# Patient Record
Sex: Male | Born: 1975 | Race: Black or African American | Hispanic: No | Marital: Single | Smoking: Current some day smoker
Health system: Southern US, Community
[De-identification: ages and names within clinical notes are randomized; demographics above are authoritative.]

---

## 2007-01-23 ENCOUNTER — Inpatient Hospital Stay (HOSPITAL_COMMUNITY): Admission: EM | Admit: 2007-01-23 | Discharge: 2007-02-03 | Payer: Self-pay | Admitting: Emergency Medicine

## 2007-02-13 ENCOUNTER — Encounter: Admission: RE | Admit: 2007-02-13 | Discharge: 2007-02-13 | Payer: Self-pay | Admitting: General Surgery

## 2011-01-07 ENCOUNTER — Emergency Department (HOSPITAL_COMMUNITY)
Admission: EM | Admit: 2011-01-07 | Discharge: 2011-01-07 | Disposition: A | Discharge status: Home / Self Care | Payer: No Typology Code available for payment source | Attending: Emergency Medicine | Admitting: Emergency Medicine

## 2011-01-07 ENCOUNTER — Emergency Department (HOSPITAL_COMMUNITY): Payer: No Typology Code available for payment source

## 2011-01-07 DIAGNOSIS — M542 Cervicalgia: Secondary | ICD-10-CM | POA: Insufficient documentation

## 2011-01-07 DIAGNOSIS — E669 Obesity, unspecified: Secondary | ICD-10-CM | POA: Insufficient documentation

## 2011-01-29 NOTE — H&P (Signed)
Jordan, Jordan            ACCOUNT NO.:  [id number]   MEDICAL RECORD NO.:  [id number]          PATIENT TYPE:  EMS   LOCATION:  MAJO                         FACILITY:  [facility]   PHYSICIAN:  Hettie Holstein, D.O.    DATE OF BIRTH:  [birth year]   DATE OF ADMISSION:  01/23/2007  DATE OF DISCHARGE:                              HISTORY & PHYSICAL   PRIMARY CARE PHYSICIAN:  Unassigned.   CHIEF COMPLAINT:  Abdominal pain.   HISTORY OF PRESENT ILLNESS:  Jordan Jordan is a very pleasant and  normally healthy 35 year old African American male, who has had no  previous hospitalizations or medical problems or known GI or bowel  irregularities in the past, who gradually developed diffuse abdominal  pain about 2 days ago.  He stated he tolerated this, continued to work,  then he stated that his abdominal pain became excruciating.  He was able  to experience some relief with inducing emesis.  He describes some  watery stools.  No blood or coffee grounds noted, no melena.  He has not  eaten for the past couple of days due to the discomfort.  He has only  had fluids and otherwise had some fevers and chills the night prior to  presentation.  He presented to the emergency department via private  vehicle and was evaluated in the emergency room, discovered to have an  elevated white count of 25,600 and underwent CT scanning of his abdomen.  Unfortunately, these findings were not conclusive.  It did reveal some  pericecal inflammation with a small amount of free air around the cecum  but there was no abscesses.  His appendix appeared normal.  He had some  fatty infiltration of the wall of his distal small bowel and throughout  the colon.  There was some suggestion of a possibility for an  inflammatory bowel disease, perhaps ulcerative colitis.   I did contact general surgery, Dr. Ezzard Standing, to review these images as  well to assure that we are not missing a surgical abdomen, though his  appendix does  appear normal.  I am electing to consult with general  surgery to review these images.   PAST MEDICAL HISTORY:  Of this is unremarkable.  No prior surgeries.   MEDICATIONS AT HOME:  He takes no medications.   ALLERGIES:  No known drug allergies.   FAMILY HISTORY:  His mother is 44, alive and well.  Father is 74, alive  and well.  No known health illnesses.  No cancer in the family, no  irritable bowel disease in the family.   REVIEW OF SYSTEMS:  He had been in his usual state of health.  His is  losing some weight.  He states that this is somewhat intentional as he  has cut meat out of his diet since October and November.  His appetite  has been stable up until recently.  He denies any blood in his stools,  any bloody emesis.  He otherwise denies any chest pain, shortness of  breath, swelling of his extremities.   SOCIAL HISTORY:  He works in a Art therapist.  He lives by  himself.  He does have a girlfriend.  Smokes occasional marijuana and  only occasional alcohol.  Otherwise no illicit drug use with the  exception of marijuana.   PHYSICAL EXAMINATION:  VITAL SIGNS:  Temperature 99.1, blood pressure  124/83, heart rate 114, respirations 22, O2 saturation 100% on room air.  GENERAL:  Jordan Jordan is nontoxic in appearance, alert and oriented,  quite pleasant.  HEENT: Head normocephalic, atraumatic.  Extraocular muscles are intact.  NECK:  Supple and nontender.  No palpable thyromegaly or mass.  CARDIOVASCULAR:  Normal S1, S2.  LUNGS:  Clear bilaterally with normal effort.  No dullness to  percussion.  ABDOMEN:  Soft.  There is no rebound or guarding or rigidity.  He does  have some tenderness to palpation of the right upper and lower regions  of his abdomen.  There is no costovertebral angle tenderness.  His bowel  sounds are normoactive.  RECTAL:  Rectal exam performed revealed no stool in the rectal vault.  Effluent was negative Hemoccult.  EXTREMITIES:  Lower  extremities reveal no edema, no cyanosis or  clubbing.  The peripheral pulses are symmetrical and palpable  bilaterally.   LABORATORY DATA:  WBC 25,600, hemoglobin 14, platelet count 301, MCV of  86.  Sodium was 137, potassium 3.8, BUN 5, creatinine 1.0, glucose 115.  AST/ALT 13/11.  Lipase is 20.   CT scan:  As noted above, there was some pericecal inflammation with  small amounts of free air, not certain.  I am awaiting further  interpretation per Dr. Ezzard Standing.   ASSESSMENT:  1. Abdominal pain with abnormal CT scan of the abdomen.  2. Leukocytosis.  3. Inflammatory finding on CT.  Perhaps ulcerative colitis, the      clinical picture is not completely consistent.  We will pursue      stool cultures and studies, and we will check an HIV screen.  4. Obesity.   PLAN:  We will consult general surgery for further review of these  images and initiate empiric antibiotics, obtain stool cultures, obtain  HIV screen, administer IV fluids, and pain control.      Hettie Holstein, D.O.  Electronically Signed     ESS/MEDQ  D:  01/23/2007  T:  01/24/2007  Job:  [id number]

## 2011-01-29 NOTE — Consult Note (Signed)
Jordan Jordan, Jordan Jordan            ACCOUNT NO.:  [id number]   MEDICAL RECORD NO.:  [id number]          PATIENT TYPE:  INP   LOCATION:  5025                         FACILITY:  [facility]   PHYSICIAN:  Sandria Bales. Ezzard Standing, M.D.  DATE OF BIRTH:  [birth year]   DATE OF CONSULTATION:  01/23/2007  DATE OF DISCHARGE:                                 CONSULTATION   STAT CONSULTATION   HISTORY OF ILLNESS:  Jordan Jordan is a 35 year old black male, who was  in otherwise good health until this past Wednesday, the 7th of May,  2008, when he developed lower abdominal pain across sort of his lower  abdomen.  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  Thursday,  he was feeling worse.  The pain was localizing more to the right lower  quadrant.  He tried a laxative without any benefit.  He induced vomiting  without any benefit.  On Friday, the 9th of May, he went to [facility]  on [address], and they referred him to the [facility] Emergency  Room.   He denies any history of peptic ulcer disease, liver disease, pancreatic  disease, or colon disease.  He has had no prior abdominal surgery.  He  has no history of Crohn's disease or ulcerative colitis.  He has had no  prior colonic exam.  He has normally regular bowel movements with  occasional constipation.  He has noticed no blood in his stool and no  change in his bowel habits.   He underwent a CT scan, which shows inflammation on the medial aspect of  his cecum/right colon with some dots of free air.  His appendix can be  traced down and it appears to be entirely normal.  I reviewed his CT  scan with Jordan Jordan.   PAST MEDICAL HISTORY:  He has no allergies.   He is on no medications.   REVIEW OF SYSTEMS:  NEUROLOGIC:  No history of seizure or loss of  consciousness.  PULMONARY:  Does not smoke cigarettes, no history of pneumonia or  tuberculosis.  CARDIAC:  He has no history of heart disease, chest pain, or  hypertension.  GASTROINTESTINAL:  See History of Present Illness.  UROLOGIC:  No history of kidney stones or kidney infections.   He is single, his girlfriend is in the room, her name is Jordan Jordan, and  his mother is in the room with him.   PHYSICAL EXAMINATION:  VITAL SIGNS:  His temperature is 98.8, his blood  pressure 126/73, pulse of 112, respirations 20.  He says his weight is  about 260 pounds.  GENERAL:  He is a well-nourished, large of not obese, black male.  HEENT:  Unremarkable.  He is bearded.  NECK:  Supple, I feel no masses or thyromegaly.  LUNGS:  Clear to auscultation with symmetric breath sounds.  HEART:  Regular rate and rhythm, I hear no murmur or rub.  ABDOMEN:  He actually has some bowel sounds present, maybe slightly  decreased.  He is tender in his right lateral and right lower quadrant  of his abdomen.  He has no palpable mass, but he is obese enough that it  would be hard to tell if there is any mass.  I feel no hernia.  Dr.  Angelena Sole has already done a rectal exam, and he had no stool in his rectal  vault.  EXTREMITIES:  He has good strength in all four extremities.  NEUROLOGIC:  Grossly intact.   LABORATORY DATA:  Sodium of 137, potassium 3.8, chloride of 101, CO2 of  28, glucose of 115, creatinine of 1.0.  His white blood count is 25,600,  his hemoglobin 14, his hematocrit 41, platelet count 301,000.  His  urinalysis was unremarkable.   IMPRESSION:  Cecal inflammation/perforation:  Whether this is cecal  diverticulitis versus typhlitis versus inflammatory bowel disease versus  some unknown diagnosis is unclear.  It appears to be highly unlikely  this is due to appendicitis.   I agree with the present treatment plan, which will be keeping him NPO,  place him on IV antibiotics, place on IV fluids, and repeat the labs  over the next couple of days and see how he progresses.  I discussed  with him about the possibility of needing a laparotomy if he does not  improve and the  possibility of bowel resection, even ostomy.  I think he  understands both the reasons for his hospitalization and the potential  complications.      Sandria Bales. Ezzard Standing, M.D.  Electronically Signed     DHN/MEDQ  D:  01/23/2007  T:  01/23/2007  Job:  [id number]   cc:   Jordan Jordan, D.O.

## 2011-02-01 NOTE — Discharge Summary (Signed)
Jordan Jordan, Jordan Jordan            ACCOUNT NO.:  [id number]   MEDICAL RECORD NO.:  [id number]          PATIENT TYPE:  INP   LOCATION:  5025                         FACILITY:  [facility]   PHYSICIAN:  Ollen Gross. Vernell Morgans, M.D. DATE OF BIRTH:  July 16, 1976   DATE OF ADMISSION:  01/23/2007  DATE OF DISCHARGE:  02/03/2007                               DISCHARGE SUMMARY   CHIEF COMPLAINT AND REASON FOR ADMISSION:  Jordan Jordan is an otherwise  healthy young man patient who began developing diffuse abdominal pain 2  days prior to admission.  Pain abruptly became more excruciating.  He  stated he felt somewhat better if he caused himself to vomit.  He had  been having watery stools without any obvious blood and melena.  Because  he was having fever and chills and was unable to eat and having  significant pain, he presented to the ER, where he was found to have a  white count of 25,600.  A CT scan was done that revealed pericecal  inflammation with a small amount of free air around the cecum without  evidence of abscess.  Appendix appeared normal; because of this, the  patient was admitted by the medicine team, but surgical consult was  obtained with Dr. Ezzard Standing.  The patient was admitted with diagnoses of  abdominal pain and microperforation on CT without evidence of abscess,  leukocytosis, fever, and obesity.   HOSPITAL COURSE:  As noted, the patient was admitted by the general  medical team, where he was placed on bowel rest, IV fluid hydration and  was started on empiric antibiotic therapy with Cipro and Flagyl.   The patient continued to slowly improve throughout the entire  hospitalization.  He continued with mild right lower quadrant  tenderness.  Bowel sounds remained intact.  He apparently had a cecal  diverticulum, based on symptoms and presentation.  His white count  bounced back and forth between the 20s to 19s and as low as into the 16s  and his pain nearly resolved.  His diet was slowly  advanced.  A repeat  CT scan was done within 48 hours of admission without any significant  changes, once reviewed by Dr. Lindie Spruce.  At this point, the patient's pain  was not severe and his white count was 20,000.   As noted, by May 14, the patient's white count trended downward to  16,000.  He was having no pain was tolerating a diet with solids food  and exam was unremarkable.  Over the next several days, his white count  continued to trend up.  A repeat CT scan was obtained and this did show  evidence of a fluid collection and possibility of needing percutaneous  drain placement; this was discussed with the patient and he agreed to  proceed.   On Jan 30, 2007, the patient was taken to Intervention Radiology, where  a right lower quadrant percutaneous drain was placed; 35 mL of purulent  material were obtained.   During this time period because the patient was having minimal pain and  was tolerating a diet, he could not understand why  he could not go home;  this was repeated discussed with the patient. In addition, because the  patient had developed this abscess cavity, vancomycin was also added to  his Cipro and Flagyl regimen.  During the same time, Interventional  Radiology was following along with [place] because of the drain placement and  Internal Medicine was following along as well, since they had admitted  the patient.   After the drain was placed, he had gradual decreasing and output from  the drain, usually averaging 5-10 mL in the 24-hour shift.  By Feb 03, 2007, he was having no pain.  His white count was 15,600.  His cultures  from the abscess grew out Strep group F, pansensitive.  As noted, his  exam was unremarkable.  A CT scan was performed that demonstrated  pericolonic abscess nearly completely resolve with right lower quadrant  retrocecal drain catheter in appropriate placement.  Therefore, the  patient was deemed appropriate for discharge home with subsequent   followup in the office this Friday with Dr. Ezzard Standing or one of his  colleagues.   FINAL DISCHARGE DIAGNOSES:  1. Perforated cecal diverticulum.  2. Pericolonic abscess, status post percutaneous drainage.  3. Obesity.   DISCHARGE MEDICATIONS:  1. Cipro 500 mg b.i.d.  2. Flagyl 500 mg t.i.d.   DISCHARGE INSTRUCTIONS:  Return to work to be determined by either Dr.  Lindie Spruce or Dr. Ezzard Standing, whoever he follows up with.   DIET:  Low-residue diet; the patient has been instructed in this prior  to discharge.   WOUND CARE:  The patient will go home with the right lower quadrant  drain in place; this will need to be flushed 3 times daily with 10 mL  and he has been instructed on how to empty and record the drainage.   FOLLOWUP:  He is to call Dr. Dixon Boos office to be seen in 1 week.      Allison L. Rennis Harding, N.POllen Gross. Vernell Morgans, M.D.  Electronically Signed    ALE/MEDQ  D:  03/18/2007  T:  03/19/2007  Job:  [id number]   cc:   Jordan Jordan, M.D.

## 2016-04-14 ENCOUNTER — Inpatient Hospital Stay (HOSPITAL_COMMUNITY)
Admission: EM | Admit: 2016-04-14 | Discharge: 2016-04-17 | DRG: 494 | Disposition: A | Discharge status: Home / Self Care | Payer: BLUE CROSS/BLUE SHIELD | Attending: Orthopedic Surgery | Admitting: Orthopedic Surgery

## 2016-04-14 ENCOUNTER — Emergency Department (HOSPITAL_COMMUNITY): Payer: BLUE CROSS/BLUE SHIELD

## 2016-04-14 ENCOUNTER — Encounter (HOSPITAL_COMMUNITY): Payer: Self-pay | Admitting: Emergency Medicine

## 2016-04-14 DIAGNOSIS — Z91018 Allergy to other foods: Secondary | ICD-10-CM | POA: Diagnosis not present

## 2016-04-14 DIAGNOSIS — S82402A Unspecified fracture of shaft of left fibula, initial encounter for closed fracture: Secondary | ICD-10-CM

## 2016-04-14 DIAGNOSIS — S82202A Unspecified fracture of shaft of left tibia, initial encounter for closed fracture: Secondary | ICD-10-CM

## 2016-04-14 DIAGNOSIS — F17290 Nicotine dependence, other tobacco product, uncomplicated: Secondary | ICD-10-CM | POA: Diagnosis present

## 2016-04-14 DIAGNOSIS — Z09 Encounter for follow-up examination after completed treatment for conditions other than malignant neoplasm: Secondary | ICD-10-CM

## 2016-04-14 DIAGNOSIS — Z419 Encounter for procedure for purposes other than remedying health state, unspecified: Secondary | ICD-10-CM

## 2016-04-14 DIAGNOSIS — T1490XA Injury, unspecified, initial encounter: Secondary | ICD-10-CM

## 2016-04-14 DIAGNOSIS — S82242A Displaced spiral fracture of shaft of left tibia, initial encounter for closed fracture: Secondary | ICD-10-CM | POA: Diagnosis present

## 2016-04-14 DIAGNOSIS — T148XXA Other injury of unspecified body region, initial encounter: Secondary | ICD-10-CM

## 2016-04-14 DIAGNOSIS — Z01818 Encounter for other preprocedural examination: Secondary | ICD-10-CM

## 2016-04-14 DIAGNOSIS — S82209A Unspecified fracture of shaft of unspecified tibia, initial encounter for closed fracture: Secondary | ICD-10-CM | POA: Diagnosis present

## 2016-04-14 LAB — CBC WITH DIFFERENTIAL/PLATELET
BASOS ABS: 0 10*3/uL (ref 0.0–0.1)
Basophils Relative: 0 %
Eosinophils Absolute: 0.7 10*3/uL (ref 0.0–0.7)
Eosinophils Relative: 5 %
HEMATOCRIT: 42.8 % (ref 39.0–52.0)
Hemoglobin: 13.9 g/dL (ref 13.0–17.0)
LYMPHS ABS: 4.7 10*3/uL — AB (ref 0.7–4.0)
LYMPHS PCT: 31 %
MCH: 29.8 pg (ref 26.0–34.0)
MCHC: 32.5 g/dL (ref 30.0–36.0)
MCV: 91.6 fL (ref 78.0–100.0)
MONO ABS: 0.8 10*3/uL (ref 0.1–1.0)
MONOS PCT: 5 %
NEUTROS ABS: 9.2 10*3/uL — AB (ref 1.7–7.7)
Neutrophils Relative %: 59 %
Platelets: 230 10*3/uL (ref 150–400)
RBC: 4.67 MIL/uL (ref 4.22–5.81)
RDW: 13.7 % (ref 11.5–15.5)
WBC: 15.5 10*3/uL — ABNORMAL HIGH (ref 4.0–10.5)

## 2016-04-14 LAB — SURGICAL PCR SCREEN
MRSA, PCR: NEGATIVE
STAPHYLOCOCCUS AUREUS: NEGATIVE

## 2016-04-14 LAB — I-STAT CHEM 8, ED
BUN: 15 mg/dL (ref 6–20)
CHLORIDE: 105 mmol/L (ref 101–111)
CREATININE: 1.2 mg/dL (ref 0.61–1.24)
Calcium, Ion: 1.04 mmol/L — ABNORMAL LOW (ref 1.13–1.30)
GLUCOSE: 119 mg/dL — AB (ref 65–99)
HCT: 44 % (ref 39.0–52.0)
Hemoglobin: 15 g/dL (ref 13.0–17.0)
POTASSIUM: 3.3 mmol/L — AB (ref 3.5–5.1)
Sodium: 142 mmol/L (ref 135–145)
TCO2: 23 mmol/L (ref 0–100)

## 2016-04-14 LAB — I-STAT CG4 LACTIC ACID, ED: Lactic Acid, Venous: 2.75 mmol/L (ref 0.5–1.9)

## 2016-04-14 MED ORDER — OXYCODONE HCL 5 MG PO TABS
5.0000 mg | ORAL_TABLET | ORAL | Status: DC | PRN
  Administered 2016-04-14 – 2016-04-17 (×15): 10 mg via ORAL
  Filled 2016-04-14 (×15): qty 2

## 2016-04-14 MED ORDER — SENNA 8.6 MG PO TABS
1.0000 | ORAL_TABLET | Freq: Two times a day (BID) | ORAL | Status: DC
  Administered 2016-04-14 – 2016-04-17 (×5): 8.6 mg via ORAL
  Filled 2016-04-14 (×5): qty 1

## 2016-04-14 MED ORDER — ONDANSETRON HCL 4 MG/2ML IJ SOLN
4.0000 mg | Freq: Once | INTRAMUSCULAR | Status: AC
  Administered 2016-04-14: 4 mg via INTRAVENOUS
  Filled 2016-04-14: qty 2

## 2016-04-14 MED ORDER — METHOCARBAMOL 500 MG PO TABS
500.0000 mg | ORAL_TABLET | Freq: Four times a day (QID) | ORAL | Status: DC | PRN
  Administered 2016-04-14 – 2016-04-17 (×7): 500 mg via ORAL
  Filled 2016-04-14 (×7): qty 1

## 2016-04-14 MED ORDER — DEXTROSE 5 % IV SOLN
500.0000 mg | Freq: Four times a day (QID) | INTRAVENOUS | Status: DC | PRN
  Filled 2016-04-14: qty 5

## 2016-04-14 MED ORDER — ENOXAPARIN SODIUM 40 MG/0.4ML ~~LOC~~ SOLN
40.0000 mg | SUBCUTANEOUS | Status: DC
  Administered 2016-04-14: 40 mg via SUBCUTANEOUS
  Filled 2016-04-14: qty 0.4

## 2016-04-14 MED ORDER — SODIUM CHLORIDE 0.9 % IV SOLN
INTRAVENOUS | Status: DC
  Administered 2016-04-14 – 2016-04-16 (×4): via INTRAVENOUS

## 2016-04-14 MED ORDER — POVIDONE-IODINE 10 % EX SWAB: 2.0000 "application " | Freq: Once | CUTANEOUS | Status: DC

## 2016-04-14 MED ORDER — POLYETHYLENE GLYCOL 3350 17 G PO PACK: 17.0000 g | PACK | Freq: Every day | ORAL | Status: DC | PRN

## 2016-04-14 MED ORDER — SODIUM CHLORIDE 0.9 % IV BOLUS (SEPSIS)
125.0000 mL | Freq: Once | INTRAVENOUS | Status: AC
  Administered 2016-04-14: 03:00:00 via INTRAVENOUS

## 2016-04-14 MED ORDER — MAGNESIUM CITRATE PO SOLN: 1.0000 | Freq: Once | ORAL | Status: DC | PRN

## 2016-04-14 MED ORDER — CHLORHEXIDINE GLUCONATE 4 % EX LIQD
60.0000 mL | Freq: Once | CUTANEOUS | Status: AC
  Administered 2016-04-15: 4 via TOPICAL

## 2016-04-14 MED ORDER — DOCUSATE SODIUM 100 MG PO CAPS
100.0000 mg | ORAL_CAPSULE | Freq: Two times a day (BID) | ORAL | Status: DC
  Administered 2016-04-14: 100 mg via ORAL
  Filled 2016-04-14: qty 1

## 2016-04-14 MED ORDER — HYDROMORPHONE HCL 1 MG/ML IJ SOLN
1.0000 mg | Freq: Once | INTRAMUSCULAR | Status: AC
  Administered 2016-04-14: 1 mg via INTRAVENOUS
  Filled 2016-04-14: qty 1

## 2016-04-14 MED ORDER — CEFAZOLIN SODIUM-DEXTROSE 2-4 GM/100ML-% IV SOLN
2.0000 g | INTRAVENOUS | Status: AC
  Administered 2016-04-15: 2 g via INTRAVENOUS
  Filled 2016-04-14 (×2): qty 100

## 2016-04-14 MED ORDER — FENTANYL CITRATE (PF) 100 MCG/2ML IJ SOLN
50.0000 ug | Freq: Once | INTRAMUSCULAR | Status: AC
  Administered 2016-04-14: 50 ug via INTRAVENOUS
  Filled 2016-04-14: qty 2

## 2016-04-14 MED ORDER — ACETAMINOPHEN 650 MG RE SUPP
650.0000 mg | Freq: Four times a day (QID) | RECTAL | Status: DC | PRN
Start: 2016-04-14 — End: 2016-04-15

## 2016-04-14 MED ORDER — SORBITOL 70 % SOLN: 30.0000 mL | Freq: Every day | Status: DC | PRN

## 2016-04-14 MED ORDER — ONDANSETRON HCL 4 MG PO TABS: 4.0000 mg | ORAL_TABLET | Freq: Four times a day (QID) | ORAL | Status: DC | PRN

## 2016-04-14 MED ORDER — ACETAMINOPHEN 325 MG PO TABS: 650.0000 mg | ORAL_TABLET | Freq: Four times a day (QID) | ORAL | Status: DC | PRN

## 2016-04-14 MED ORDER — ONDANSETRON HCL 4 MG/2ML IJ SOLN: 4.0000 mg | Freq: Four times a day (QID) | INTRAMUSCULAR | Status: DC | PRN

## 2016-04-14 MED ORDER — HYDROMORPHONE HCL 1 MG/ML IJ SOLN
0.5000 mg | INTRAMUSCULAR | Status: DC | PRN
  Administered 2016-04-14 – 2016-04-16 (×7): 0.5 mg via INTRAVENOUS
  Filled 2016-04-14 (×8): qty 1
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
## 2016-04-14 NOTE — Progress Notes (Signed)
Orthopedic Tech Progress Note Patient Details:  Jordan Jordan 1976-05-08 [id number]  Ortho Devices Type of Ortho Device: Post (long leg) splint Ortho Device/Splint Location: lle Ortho Device/Splint Interventions: Ordered, Application   Trinna Post 04/14/2016, 3:29 AM
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
## 2016-04-14 NOTE — ED Notes (Signed)
Taken to CT at this time. 

## 2016-04-14 NOTE — Progress Notes (Signed)
COMPARTMENT CHECK  LLE in splint. (+) TA/GS/EHL. Compartments soft. No pain with passive stretch. SILT and symmetric to contralateral side. Nursing to continue checks. Plan for OR tomorrow.

## 2016-04-14 NOTE — ED Notes (Signed)
Patient requesting something for pain, this RN attempted to get pain medication per patient's order but unable to get meds out of the pyxis due to meds not being verified yet.  Synetta Fail, RN on 5N made aware that patient was unable to get pain meds before coming up to his room.

## 2016-04-15 ENCOUNTER — Inpatient Hospital Stay (HOSPITAL_COMMUNITY): Payer: BLUE CROSS/BLUE SHIELD

## 2016-04-15 ENCOUNTER — Inpatient Hospital Stay (HOSPITAL_COMMUNITY): Payer: BLUE CROSS/BLUE SHIELD | Admitting: Anesthesiology

## 2016-04-15 ENCOUNTER — Encounter (HOSPITAL_COMMUNITY): Payer: Self-pay | Admitting: Anesthesiology

## 2016-04-15 ENCOUNTER — Encounter (HOSPITAL_COMMUNITY)
Admission: EM | Disposition: A | Discharge status: Home / Self Care | Payer: Self-pay | Source: Home / Self Care | Attending: Orthopedic Surgery

## 2016-04-15 DIAGNOSIS — S82242A Displaced spiral fracture of shaft of left tibia, initial encounter for closed fracture: Secondary | ICD-10-CM | POA: Diagnosis present

## 2016-04-15 HISTORY — PX: TIBIA IM NAIL INSERTION: SHX2516

## 2016-04-15 LAB — CBC
HCT: 40.8 % (ref 39.0–52.0)
Hemoglobin: 12.9 g/dL — ABNORMAL LOW (ref 13.0–17.0)
MCH: 29.1 pg (ref 26.0–34.0)
MCHC: 31.6 g/dL (ref 30.0–36.0)
MCV: 92.1 fL (ref 78.0–100.0)
Platelets: 239 10*3/uL (ref 150–400)
RBC: 4.43 MIL/uL (ref 4.22–5.81)
RDW: 13.8 % (ref 11.5–15.5)
WBC: 13.8 10*3/uL — ABNORMAL HIGH (ref 4.0–10.5)

## 2016-04-15 LAB — CREATININE, SERUM
Creatinine, Ser: 1.09 mg/dL (ref 0.61–1.24)
GFR calc Af Amer: >60 mL/min (ref >60)
GFR calc non Af Amer: >60 mL/min (ref >60)

## 2016-04-15 SURGERY — INSERTION, INTRAMEDULLARY ROD, TIBIA
Anesthesia: General | Laterality: Left

## 2016-04-15 SURGERY — Surgical Case
Anesthesia: *Unknown

## 2016-04-15 MED ORDER — FENTANYL CITRATE (PF) 100 MCG/2ML IJ SOLN
INTRAMUSCULAR | Status: DC | PRN
  Administered 2016-04-15: 50 ug via INTRAVENOUS
  Administered 2016-04-15: 100 ug via INTRAVENOUS
  Administered 2016-04-15 (×4): 50 ug via INTRAVENOUS

## 2016-04-15 MED ORDER — LACTATED RINGERS IV SOLN
INTRAVENOUS | Status: DC
  Administered 2016-04-15: 10:00:00 via INTRAVENOUS

## 2016-04-15 MED ORDER — ACETAMINOPHEN 325 MG PO TABS
650.0000 mg | ORAL_TABLET | Freq: Four times a day (QID) | ORAL | Status: DC | PRN
  Administered 2016-04-16 – 2016-04-17 (×4): 650 mg via ORAL
  Filled 2016-04-15 (×4): qty 2

## 2016-04-15 MED ORDER — DEXAMETHASONE SODIUM PHOSPHATE 10 MG/ML IJ SOLN
INTRAMUSCULAR | Status: AC
  Filled 2016-04-15: qty 1

## 2016-04-15 MED ORDER — FENTANYL CITRATE (PF) 250 MCG/5ML IJ SOLN
INTRAMUSCULAR | Status: AC
  Filled 2016-04-15: qty 5

## 2016-04-15 MED ORDER — FENTANYL CITRATE (PF) 100 MCG/2ML IJ SOLN
INTRAMUSCULAR | Status: AC
  Filled 2016-04-15: qty 2

## 2016-04-15 MED ORDER — GLYCOPYRROLATE 0.2 MG/ML IV SOSY
PREFILLED_SYRINGE | INTRAVENOUS | Status: AC
  Filled 2016-04-15: qty 3

## 2016-04-15 MED ORDER — MIDAZOLAM HCL 2 MG/2ML IJ SOLN
INTRAMUSCULAR | Status: AC
  Filled 2016-04-15: qty 2

## 2016-04-15 MED ORDER — ONDANSETRON HCL 4 MG PO TABS: 4.0000 mg | ORAL_TABLET | Freq: Four times a day (QID) | ORAL | Status: DC | PRN

## 2016-04-15 MED ORDER — NEOSTIGMINE METHYLSULFATE 5 MG/5ML IV SOSY
PREFILLED_SYRINGE | INTRAVENOUS | Status: AC
  Filled 2016-04-15: qty 5

## 2016-04-15 MED ORDER — CEFAZOLIN SODIUM-DEXTROSE 2-4 GM/100ML-% IV SOLN
2.0000 g | Freq: Four times a day (QID) | INTRAVENOUS | Status: AC
  Administered 2016-04-15 – 2016-04-16 (×3): 2 g via INTRAVENOUS
  Filled 2016-04-15 (×3): qty 100

## 2016-04-15 MED ORDER — METOCLOPRAMIDE HCL 5 MG/ML IJ SOLN: 5.0000 mg | Freq: Three times a day (TID) | INTRAMUSCULAR | Status: DC | PRN

## 2016-04-15 MED ORDER — LACTATED RINGERS IV SOLN
INTRAVENOUS | Status: DC
  Administered 2016-04-15 (×3): via INTRAVENOUS

## 2016-04-15 MED ORDER — ARTIFICIAL TEARS OP OINT
TOPICAL_OINTMENT | OPHTHALMIC | Status: AC
  Filled 2016-04-15: qty 3.5

## 2016-04-15 MED ORDER — ONDANSETRON HCL 4 MG/2ML IJ SOLN
4.0000 mg | Freq: Once | INTRAMUSCULAR | Status: AC | PRN
  Administered 2016-04-15: 4 mg via INTRAVENOUS

## 2016-04-15 MED ORDER — ONDANSETRON HCL 4 MG/2ML IJ SOLN: 4.0000 mg | Freq: Four times a day (QID) | INTRAMUSCULAR | Status: DC | PRN

## 2016-04-15 MED ORDER — NEOSTIGMINE METHYLSULFATE 10 MG/10ML IV SOLN
INTRAVENOUS | Status: DC | PRN
  Administered 2016-04-15: 3 mg via INTRAVENOUS

## 2016-04-15 MED ORDER — ACETAMINOPHEN 650 MG RE SUPP: 650.0000 mg | Freq: Four times a day (QID) | RECTAL | Status: DC | PRN

## 2016-04-15 MED ORDER — GLYCOPYRROLATE 0.2 MG/ML IJ SOLN
INTRAMUSCULAR | Status: DC | PRN
  Administered 2016-04-15: 0.4 mg via INTRAVENOUS

## 2016-04-15 MED ORDER — LIDOCAINE HCL (CARDIAC) 20 MG/ML IV SOLN
INTRAVENOUS | Status: DC | PRN
  Administered 2016-04-15: 100 mg via INTRAVENOUS

## 2016-04-15 MED ORDER — ROCURONIUM BROMIDE 100 MG/10ML IV SOLN
INTRAVENOUS | Status: DC | PRN
  Administered 2016-04-15: 10 mg via INTRAVENOUS
  Administered 2016-04-15: 20 mg via INTRAVENOUS

## 2016-04-15 MED ORDER — MIDAZOLAM HCL 5 MG/5ML IJ SOLN
INTRAMUSCULAR | Status: DC | PRN
  Administered 2016-04-15 (×3): 1 mg via INTRAVENOUS

## 2016-04-15 MED ORDER — HYDROMORPHONE HCL 1 MG/ML IJ SOLN: 0.5000 mg | INTRAMUSCULAR | Status: DC | PRN

## 2016-04-15 MED ORDER — 0.9 % SODIUM CHLORIDE (POUR BTL) OPTIME
TOPICAL | Status: DC | PRN
  Administered 2016-04-15: 1000 mL

## 2016-04-15 MED ORDER — PROPOFOL 10 MG/ML IV BOLUS
INTRAVENOUS | Status: DC | PRN
  Administered 2016-04-15: 200 mg via INTRAVENOUS

## 2016-04-15 MED ORDER — DEXAMETHASONE SODIUM PHOSPHATE 10 MG/ML IJ SOLN
INTRAMUSCULAR | Status: DC | PRN
  Administered 2016-04-15: 10 mg via INTRAVENOUS

## 2016-04-15 MED ORDER — LIDOCAINE 2% (20 MG/ML) 5 ML SYRINGE
INTRAMUSCULAR | Status: AC
  Filled 2016-04-15: qty 5

## 2016-04-15 MED ORDER — SUCCINYLCHOLINE CHLORIDE 20 MG/ML IJ SOLN
INTRAMUSCULAR | Status: DC | PRN
  Administered 2016-04-15: 100 mg via INTRAVENOUS

## 2016-04-15 MED ORDER — ONDANSETRON HCL 4 MG/2ML IJ SOLN
INTRAMUSCULAR | Status: AC
  Filled 2016-04-15: qty 2

## 2016-04-15 MED ORDER — SUCCINYLCHOLINE CHLORIDE 200 MG/10ML IV SOSY
PREFILLED_SYRINGE | INTRAVENOUS | Status: AC
  Filled 2016-04-15: qty 10

## 2016-04-15 MED ORDER — METOCLOPRAMIDE HCL 5 MG PO TABS: 5.0000 mg | ORAL_TABLET | Freq: Three times a day (TID) | ORAL | Status: DC | PRN

## 2016-04-15 MED ORDER — ROCURONIUM BROMIDE 50 MG/5ML IV SOLN
INTRAVENOUS | Status: AC
  Filled 2016-04-15: qty 1

## 2016-04-15 MED ORDER — ENOXAPARIN SODIUM 40 MG/0.4ML ~~LOC~~ SOLN
40.0000 mg | SUBCUTANEOUS | Status: DC
  Administered 2016-04-16 – 2016-04-17 (×2): 40 mg via SUBCUTANEOUS
  Filled 2016-04-15 (×2): qty 0.4

## 2016-04-15 SURGICAL SUPPLY — 63 items
BANDAGE ACE 6X5 VEL STRL LF (GAUZE/BANDAGES/DRESSINGS) ×3 IMPLANT
BANDAGE ELASTIC 4 VELCRO ST LF (GAUZE/BANDAGES/DRESSINGS) ×3 IMPLANT
BANDAGE ELASTIC 6 VELCRO ST LF (GAUZE/BANDAGES/DRESSINGS) ×3 IMPLANT
BANDAGE ESMARK 6X9 LF (GAUZE/BANDAGES/DRESSINGS) IMPLANT
BIT DRILL 2.5X2.75 QC CALB (BIT) ×3 IMPLANT
BIT DRILL 3.8X6 NS (BIT) ×3 IMPLANT
BIT DRILL 4.4 NS (BIT) ×3 IMPLANT
BNDG ESMARK 6X9 LF (GAUZE/BANDAGES/DRESSINGS)
CHLORAPREP W/TINT 26ML (MISCELLANEOUS) ×3 IMPLANT
COVER MAYO STAND STRL (DRAPES) ×3 IMPLANT
COVER SURGICAL LIGHT HANDLE (MISCELLANEOUS) ×3 IMPLANT
CUFF TOURNIQUET SINGLE 34IN LL (TOURNIQUET CUFF) ×3 IMPLANT
DRAPE C-ARM 42X72 X-RAY (DRAPES) ×3 IMPLANT
DRAPE C-ARMOR (DRAPES) ×3 IMPLANT
DRAPE EXTREMITY T 121X128X90 (DRAPE) ×3 IMPLANT
DRAPE IMP U-DRAPE 54X76 (DRAPES) ×3 IMPLANT
DRAPE POUCH INSTRU U-SHP 10X18 (DRAPES) ×3 IMPLANT
DRAPE U-SHAPE 47X51 STRL (DRAPES) ×3 IMPLANT
DRAPE UTILITY XL STRL (DRAPES) ×6 IMPLANT
DRSG ADAPTIC 3X8 NADH LF (GAUZE/BANDAGES/DRESSINGS) ×3 IMPLANT
DRSG PAD ABDOMINAL 8X10 ST (GAUZE/BANDAGES/DRESSINGS) ×9 IMPLANT
ELECT CAUTERY BLADE 6.4 (BLADE) ×3 IMPLANT
ELECT REM PT RETURN 9FT ADLT (ELECTROSURGICAL) ×3
ELECTRODE REM PT RTRN 9FT ADLT (ELECTROSURGICAL) ×1 IMPLANT
FACESHIELD WRAPAROUND (MASK) ×3 IMPLANT
GAUZE SPONGE 4X4 12PLY STRL (GAUZE/BANDAGES/DRESSINGS) ×3 IMPLANT
GAUZE XEROFORM 1X8 LF (GAUZE/BANDAGES/DRESSINGS) ×6 IMPLANT
GLOVE BIOGEL PI IND STRL 6 (GLOVE) ×1 IMPLANT
GLOVE BIOGEL PI INDICATOR 6 (GLOVE) ×2
GLOVE SKINSENSE NS SZ7.5 (GLOVE) ×8
GLOVE SKINSENSE STRL SZ7.5 (GLOVE) ×4 IMPLANT
GLOVE SURG SS PI 6.0 STRL IVOR (GLOVE) ×9 IMPLANT
GOWN STRL REIN XL XLG (GOWN DISPOSABLE) ×3 IMPLANT
GUIDEWIRE BALL NOSE 80CM (WIRE) ×3 IMPLANT
KIT BASIN OR (CUSTOM PROCEDURE TRAY) ×3 IMPLANT
MANIFOLD NEPTUNE II (INSTRUMENTS) ×3 IMPLANT
NAIL IM TIBIAL 10X34.5 (Orthopedic Implant) ×1 IMPLANT
NS IRRIG 1000ML POUR BTL (IV SOLUTION) ×3 IMPLANT
PACK TOTAL JOINT (CUSTOM PROCEDURE TRAY) ×3 IMPLANT
PAD CAST 4YDX4 CTTN HI CHSV (CAST SUPPLIES) ×1 IMPLANT
PADDING CAST ABS 4INX4YD NS (CAST SUPPLIES) ×2
PADDING CAST ABS COTTON 4X4 ST (CAST SUPPLIES) ×1 IMPLANT
PADDING CAST COTTON 4X4 STRL (CAST SUPPLIES) ×3
PIN GUIDE 3.2 903003004 (MISCELLANEOUS) ×3 IMPLANT
PLATE TUB 100DEG 5 HO (Plate) ×3 IMPLANT
SCREW ACECAP 38MM (Screw) ×3 IMPLANT
SCREW ACECAP 46MM (Screw) ×3 IMPLANT
SCREW CORTICAL 3.5MM  10MM (Screw) ×4 IMPLANT
SCREW CORTICAL 3.5MM  12MM (Screw) ×4 IMPLANT
SCREW CORTICAL 3.5MM 10MM (Screw) ×2 IMPLANT
SCREW CORTICAL 3.5MM 12MM (Screw) ×2 IMPLANT
SCREW PROXIMAL DEPUY (Screw) ×6 IMPLANT
SCREW PRXML FT 55X5.5XNS TIB (Screw) ×1 IMPLANT
SCREW PRXML FT 65X5.5XNS CORT (Screw) ×1 IMPLANT
STAPLER SKIN PROX WIDE 3.9 (STAPLE) ×3 IMPLANT
SUT ETHIBOND 2 0 FS (SUTURE) ×12 IMPLANT
SUT VIC AB 0 CT1 27 (SUTURE)
SUT VIC AB 0 CT1 27XBRD ANTBC (SUTURE) IMPLANT
SUT VIC AB 2-0 CT1 27 (SUTURE) ×6
SUT VIC AB 2-0 CT1 TAPERPNT 27 (SUTURE) ×2 IMPLANT
TIBIAL NAIL 10X34.5 (Orthopedic Implant) ×3 IMPLANT
TOWEL OR 17X26 10 PK STRL BLUE (TOWEL DISPOSABLE) ×6 IMPLANT
WATER STERILE IRR 1000ML POUR (IV SOLUTION) IMPLANT

## 2016-04-15 NOTE — Anesthesia Postprocedure Evaluation (Signed)
Anesthesia Post Note  Patient: Jordan Jordan  Procedure(s) Performed: Procedure(s) (LRB): INTRAMEDULLARY (IM) NAIL TIBIAL (Left)  Patient location during evaluation: PACU Anesthesia Type: General Level of consciousness: awake, oriented and sedated Pain management: pain level controlled Vital Signs Assessment: post-procedure vital signs reviewed and stable Respiratory status: spontaneous breathing and respiratory function stable Cardiovascular status: blood pressure returned to baseline and stable Anesthetic complications: no    Last Vitals:  Vitals:   04/15/16 0646 04/15/16 1504  BP: 110/72 135/82  Pulse: 73 86  Resp: 16   Temp: 36.9 C 36.7 C    Last Pain:  Vitals:   04/15/16 0728  TempSrc:   PainSc: 5                  Avon Mergenthaler EDWARD

## 2016-04-15 NOTE — Anesthesia Preprocedure Evaluation (Addendum)
Anesthesia Evaluation  Patient identified by MRN, date of birth, ID band Patient awake    Reviewed: Allergy & Precautions, NPO status , Patient's Chart, lab work & pertinent test results  Airway Mallampati: I  TM Distance: >3 FB Neck ROM: Full    Dental  (+) Teeth Intact, Dental Advisory Given   Pulmonary Current Smoker,    Pulmonary exam normal        Cardiovascular Normal cardiovascular exam     Neuro/Psych    GI/Hepatic   Endo/Other    Renal/GU      Musculoskeletal   Abdominal   Peds  Hematology   Anesthesia Other Findings   Reproductive/Obstetrics                            Anesthesia Physical Anesthesia Plan  ASA: I  Anesthesia Plan: General   Post-op Pain Management:    Induction: Intravenous  Airway Management Planned: LMA and Oral ETT  Additional Equipment:   Intra-op Plan:   Post-operative Plan: Extubation in OR  Informed Consent: I have reviewed the patients History and Physical, chart, labs and discussed the procedure including the risks, benefits and alternatives for the proposed anesthesia with the patient or authorized representative who has indicated his/her understanding and acceptance.     Plan Discussed with: CRNA, Anesthesiologist and Surgeon  Anesthesia Plan Comments:         Anesthesia Quick Evaluation

## 2016-04-15 NOTE — Progress Notes (Signed)
S: Complains of left lower leg pain. Denies paresthesias.  O: AF VSS  NAD Abd SNTND LLE in splint. (+) TA/GS/EHL. Compartments soft. No pain with passive stretch. SILT and symmetric to contralateral side.  A/P: Left proximal tibia shaft fracture Plan for OR today for IM fixation Cont NPO Hold chemical DVT ppx preop

## 2016-04-15 NOTE — Brief Op Note (Signed)
04/15/2016  2:57 PM  PATIENT:  Jordan Jordan  40 y.o. male  PRE-OPERATIVE DIAGNOSIS:  Left Tibial Fx  POST-OPERATIVE DIAGNOSIS:  Left Tibial Fx  PROCEDURE:  Procedure(s): INTRAMEDULLARY (IM) NAIL TIBIAL (Left)  SURGEON:  Surgeon(s) and Role:    * Samson Frederic, MD - Primary  PHYSICIAN ASSISTANT: none  ASSISTANTS: justin queen, RNFA   ANESTHESIA:   general  EBL:  Total I/O In: 2000 [I.V.:2000] Out: 450 [Urine:200; Blood:250]  BLOOD ADMINISTERED:none  DRAINS: none   LOCAL MEDICATIONS USED:  NONE  SPECIMEN:  No Specimen  DISPOSITION OF SPECIMEN:  N/A  COUNTS:  YES  TOURNIQUET:  * No tourniquets in log *  DICTATION: .Other Dictation: Dictation Number report # not provided by phone system  PLAN OF CARE: Admit to inpatient   PATIENT DISPOSITION:  PACU - hemodynamically stable.   Delay start of Pharmacological VTE agent (>24hrs) due to surgical blood loss or risk of bleeding: no

## 2016-04-15 NOTE — Anesthesia Procedure Notes (Signed)
Procedure Name: Intubation Date/Time: 04/15/2016 11:14 AM Performed by: Marni Griffon Pre-anesthesia Checklist: Emergency Drugs available, Patient identified, Suction available and Patient being monitored Patient Re-evaluated:Patient Re-evaluated prior to inductionOxygen Delivery Method: Circle system utilized Preoxygenation: Pre-oxygenation with 100% oxygen Intubation Type: IV induction Ventilation: Mask ventilation without difficulty Laryngoscope Size: Mac and 4 Grade View: Grade I Tube type: Oral Tube size: 7.5 mm Number of attempts: 1 Airway Equipment and Method: Stylet Placement Confirmation: ETT inserted through vocal cords under direct vision,  positive ETCO2 and breath sounds checked- equal and bilateral Secured at: 22 (cm at teeth) cm Tube secured with: Tape Dental Injury: Teeth and Oropharynx as per pre-operative assessment

## 2016-04-15 NOTE — Transfer of Care (Signed)
Immediate Anesthesia Transfer of Care Note  Patient: Jordan Jordan  Procedure(s) Performed: Procedure(s): INTRAMEDULLARY (IM) NAIL TIBIAL (Left)  Patient Location: PACU  Anesthesia Type:General  Level of Consciousness: awake and patient cooperative  Airway & Oxygen Therapy: Patient Spontanous Breathing and Patient connected to nasal cannula oxygen  Post-op Assessment: Report given to RN, Post -op Vital signs reviewed and stable and Patient moving all extremities  Post vital signs: Reviewed and stable  Last Vitals:  Vitals:   04/14/16 2100 04/15/16 0646  BP: 110/60 110/72  Pulse: 80 73  Resp: 16 16  Temp: 37.2 C 36.9 C    Last Pain:  Vitals:   04/15/16 0728  TempSrc:   PainSc: 5          Complications: No apparent anesthesia complications

## 2016-04-15 NOTE — Interval H&P Note (Signed)
History and Physical Interval Note:  04/15/2016 10:59 AM  Jordan Jordan  has presented today for surgery, with the diagnosis of Left Tibial Fx  The various methods of treatment have been discussed with the patient and family. After consideration of risks, benefits and other options for treatment, the patient has consented to  Procedure(s): INTRAMEDULLARY (IM) NAIL TIBIAL (Left) as a surgical intervention .  [name] history has been reviewed, patient examined, no change in status, stable for surgery.  I have reviewed the patient's chart and labs.  Questions were answered to the patient's satisfaction.     Jordan Jordan, Jordan Jordan
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
## 2016-04-16 ENCOUNTER — Encounter (HOSPITAL_COMMUNITY): Payer: Self-pay | Admitting: Orthopedic Surgery

## 2016-04-16 NOTE — Progress Notes (Signed)
   Subjective: Patient reports pain as mild to moderate.  No c/o.  Objective:   VITALS:   Vitals:   04/15/16 1504 04/15/16 1518 04/15/16 2100 04/16/16 0500  BP: 135/82  127/67 123/72  Pulse: 86  97 85  Resp:  15 18 18   Temp: 98 F (36.7 C)  97.9 F (36.6 C) 98.2 F (36.8 C)  TempSrc:   Oral Oral  SpO2: 98%  99% 96%  Weight:      Height:        Sensation intact distally Intact pulses distally Dorsiflexion/Plantar flexion intact Incision: dressing C/D/I Compartment soft No pain with passive stretch  Lab Results  Component Value Date   WBC 13.8 (H) 04/15/2016   HGB 12.9 (L) 04/15/2016   HCT 40.8 04/15/2016   MCV 92.1 04/15/2016   PLT 239 04/15/2016   BMET    Component Value Date/Time   NA 142 04/14/2016 0320   K 3.3 (L) 04/14/2016 0320   CL 105 04/14/2016 0320   GLUCOSE 119 (H) 04/14/2016 0320   BUN 15 04/14/2016 0320   CREATININE 1.09 04/15/2016 1628   GFRNONAA >60 04/15/2016 1628   GFRAA >60 04/15/2016 1628     Assessment/Plan: 1 Day Post-Op   Active Problems:   Closed fracture of shaft of tibia   Closed displaced spiral fracture of shaft of left tibia   NWB LLE DVT ppx: lovenox in house, out on ASA PO pain control PT/OT Anticipate d/c home tomorrow   Garnet Koyanagi 04/16/2016, 8:01 AM   Samson Frederic, MD Cell [phone]

## 2016-04-16 NOTE — Evaluation (Signed)
Occupational Therapy Evaluation Patient Details Name: Jordan Jordan MRN: [id number] DOB: 15-Sep-1976 Today's Date: 04/16/2016    History of Present Illness pt presents with L Tibia fx s/p ORIF.  pt with no PMH.     Clinical Impression   Pt was independent prior to admission. Presents with L LE pain with limited ROM and impaired balance impeding ability for pt to care for himself and mobilize. Educated pt to dress L LE first and undress last, use of AE for LB bathing and dressing and 3 in 1 as toilet riser. Pt demonstrated ability to maintain NWB on L LE with RW. Plans to sponge bathe at home and will be alone during the day. Will follow to address toilet transfers, standing ADL, transporting items safely with walker and LB ADL.    Follow Up Recommendations  No OT follow up    Equipment Recommendations  3 in 1 bedside comode    Recommendations for Other Services       Precautions / Restrictions Precautions Precautions: Fall Restrictions Weight Bearing Restrictions: Yes LLE Weight Bearing: Non weight bearing      Mobility Bed Mobility               General bed mobility comments: pt in chair  Transfers Overall transfer level: Needs assistance Equipment used: Rolling walker (2 wheeled) Transfers: Sit to/from Stand Sit to Stand: Min guard         General transfer comment: cues for technique, good maintenance of NWB on L LE    Balance     Sitting balance-Leahy Scale: Good       Standing balance-Leahy Scale: Poor                              ADL Overall ADL's : Needs assistance/impaired Eating/Feeding: Independent;Sitting   Grooming: Set up;Wash/dry hands;Wash/dry face;Oral care;Sitting   Upper Body Bathing: Set up;Sitting   Lower Body Bathing: Minimal assistance;Sit to/from stand   Upper Body Dressing : Set up;Sitting   Lower Body Dressing: Minimal assistance;Sit to/from stand   Toilet Transfer: Min guard;RW;BSC;Ambulation    Toileting- Architect and Hygiene: Minimal assistance;Sit to/from stand       Functional mobility during ADLs: Min guard;Rolling walker General ADL Comments: Pt educated in use of AE for LB bathing and dressing. Aware that AE is not covered by insurance and can be purchased in gift shop. Educated in use of 3 in 1 to elevate toilet.     Vision     Perception     Praxis      Pertinent Vitals/Pain Pain Assessment: Faces Faces Pain Scale: Hurts little more Pain Location: L LE Pain Descriptors / Indicators: Aching;Grimacing;Guarding Pain Intervention(s): Monitored during session;Premedicated before session;Repositioned;Ice applied     Hand Dominance Right   Extremity/Trunk Assessment Upper Extremity Assessment Upper Extremity Assessment: Overall WFL for tasks assessed   Lower Extremity Assessment Lower Extremity Assessment: Defer to PT evaluation   Cervical / Trunk Assessment Cervical / Trunk Assessment: Normal   Communication Communication Communication: No difficulties   Cognition Arousal/Alertness: Awake/alert Behavior During Therapy: WFL for tasks assessed/performed Overall Cognitive Status: Within Functional Limits for tasks assessed                     General Comments       Exercises       Shoulder Instructions      Home Living Family/patient expects to be discharged  to:: Private residence Living Arrangements: Spouse/significant other Available Help at Discharge: Family;Available PRN/intermittently Type of Home: House Home Access: Level entry     Home Layout: Two level;1/2 bath on main level;Able to live on main level with bedroom/bathroom     Bathroom Shower/Tub: Tub/shower unit (upstairs)   Bathroom Toilet: Standard     Home Equipment: None   Additional Comments: pt plans to remain on the first floor of his home and sponge bathe      Prior Functioning/Environment Level of Independence: Independent             OT  Diagnosis: Generalized weakness;Acute pain   OT Problem List: Decreased activity tolerance;Impaired balance (sitting and/or standing);Decreased knowledge of use of DME or AE;Decreased knowledge of precautions;Pain   OT Treatment/Interventions: Self-care/ADL training;DME and/or AE instruction;Patient/family education    OT Goals(Current goals can be found in the care plan section) Acute Rehab OT Goals Patient Stated Goal: Back to normal. OT Goal Formulation: With patient Time For Goal Achievement: 04/23/16 Potential to Achieve Goals: Good  OT Frequency: Min 2X/week   Barriers to D/C:            Co-evaluation              End of Session Equipment Utilized During Treatment: Gait belt;Rolling walker  Activity Tolerance: Patient tolerated treatment well Patient left: in chair;with call bell/phone within reach;with family/visitor present   Time: 1531-1559 OT Time Calculation (min): 28 min Charges:  OT General Charges $OT Visit: 1 Procedure OT Evaluation $OT Eval Low Complexity: 1 Procedure OT Treatments $Self Care/Home Management : 8-22 mins G-Codes:    Evern Bio 04/16/2016, 4:06 PM  [phone]

## 2016-04-16 NOTE — Evaluation (Signed)
Physical Therapy Evaluation Patient Details Name: Jordan Jordan MRN: [id number] DOB: [birth year] Today's Date: 04/16/2016   History of Present Illness  pt presents with L Tibia fx s/p ORIF.  pt with no PMH.    Clinical Impression  Pt moving well and following all cueing.  Pt does a great job maintaining NWBing during mobility and anticipate will make good progress to return to home with family.  Will continue to follow.      Follow Up Recommendations Home health PT;Supervision - Intermittent    Equipment Recommendations  Rolling walker with 5" wheels;Wheelchair (measurements PT);Wheelchair cushion (measurements PT)    Recommendations for Other Services       Precautions / Restrictions Precautions Precautions: Fall Restrictions Weight Bearing Restrictions: Yes LLE Weight Bearing: Non weight bearing      Mobility  Bed Mobility Overal bed mobility: Needs Assistance Bed Mobility: Supine to Sit     Supine to sit: Min guard     General bed mobility comments: pt able to bring himself to sitting EOB, but did request PT guard his L LE incase he dropped it.  pt used his R foot under his L foot to move L LE.    Transfers Overall transfer level: Needs assistance Equipment used: Rolling walker (2 wheeled) Transfers: Sit to/from Stand Sit to Stand: Min guard         General transfer comment: cues for UE use only.    Ambulation/Gait Ambulation/Gait assistance: Min guard Ambulation Distance (Feet): 30 Feet Assistive device: Rolling walker (2 wheeled) Gait Pattern/deviations: Step-to pattern     General Gait Details: pt does well maintaining NWBing.  Needs cues for use of RW.    Stairs            Wheelchair Mobility    Modified Rankin (Stroke Patients Only)       Balance Overall balance assessment: Needs assistance Sitting-balance support: No upper extremity supported;Feet supported Sitting balance-Leahy Scale: Good     Standing balance support:  Bilateral upper extremity supported;During functional activity Standing balance-Leahy Scale: Poor                               Pertinent Vitals/Pain Pain Assessment: 0-10 Pain Score: 6  Pain Location: L LE Pain Descriptors / Indicators: Aching;Grimacing;Guarding Pain Intervention(s): Monitored during session;Premedicated before session;Repositioned    Home Living Family/patient expects to be discharged to:: Private residence Living Arrangements: Spouse/significant other Available Help at Discharge: Family;Available PRN/intermittently Type of Home: House Home Access: Level entry     Home Layout: Two level;1/2 bath on main level;Able to live on main level with bedroom/bathroom Home Equipment: None Additional Comments: pt indicates he will be sleeping on couch and using half bath until he is able to ascend stairs to 2nd floor.  pt also states he has a friend that might have equipment he can borrow and is supposed to check on this tonight.      Prior Function Level of Independence: Independent               Hand Dominance        Extremity/Trunk Assessment   Upper Extremity Assessment: Defer to OT evaluation           Lower Extremity Assessment: LLE deficits/detail   LLE Deficits / Details: pt very painful post-op limiting AROM and strength.  Sensation intact.    Cervical / Trunk Assessment: Normal  Communication   Communication: No difficulties  Cognition Arousal/Alertness: Awake/alert Behavior During Therapy: WFL for tasks assessed/performed Overall Cognitive Status: Within Functional Limits for tasks assessed                      General Comments      Exercises        Assessment/Plan    PT Assessment Patient needs continued PT services  PT Diagnosis Difficulty walking;Acute pain   PT Problem List Decreased strength;Decreased range of motion;Decreased activity tolerance;Decreased balance;Decreased mobility;Decreased knowledge of  use of DME;Decreased knowledge of precautions;Pain  PT Treatment Interventions DME instruction;Gait training;Functional mobility training;Therapeutic activities;Therapeutic exercise;Balance training;Patient/family education   PT Goals (Current goals can be found in the Care Plan section) Acute Rehab PT Goals Patient Stated Goal: Back to normal. PT Goal Formulation: With patient Time For Goal Achievement: 04/23/16 Potential to Achieve Goals: Good    Frequency Min 5X/week   Barriers to discharge        Co-evaluation               End of Session Equipment Utilized During Treatment: Gait belt Activity Tolerance: Patient tolerated treatment well Patient left: in chair;with call bell/phone within reach Nurse Communication: Mobility status         Time: 1139-1208 PT Time Calculation (min) (ACUTE ONLY): 29 min   Charges:   PT Evaluation $PT Eval Moderate Complexity: 1 Procedure PT Treatments $Gait Training: 8-22 mins   PT G CodesSunny Schlein, [place]ella [phone] 04/16/2016, 12:30 PM

## 2016-04-17 MED ORDER — DOCUSATE SODIUM 100 MG PO CAPS: 100.0000 mg | ORAL_CAPSULE | Freq: Two times a day (BID) | ORAL | 3 refills | Status: AC

## 2016-04-17 MED ORDER — ASPIRIN EC 325 MG PO TBEC: 325.0000 mg | DELAYED_RELEASE_TABLET | Freq: Two times a day (BID) | ORAL | 0 refills | Status: AC

## 2016-04-17 MED ORDER — SENNA 8.6 MG PO TABS: 2.0000 | ORAL_TABLET | Freq: Every day | ORAL | 3 refills | Status: AC

## 2016-04-17 MED ORDER — OXYCODONE-ACETAMINOPHEN 5-325 MG PO TABS: 1.0000 | ORAL_TABLET | ORAL | 0 refills | Status: AC | PRN

## 2016-04-17 MED ORDER — ONDANSETRON HCL 4 MG PO TABS: 4.0000 mg | ORAL_TABLET | Freq: Four times a day (QID) | ORAL | 0 refills | Status: AC | PRN

## 2016-04-17 NOTE — Progress Notes (Signed)
Pt discharge education and instructions completed with pt and mother at bedside; all voices understanding and denies any questions. Pt IV removed; leg incision dsg remains clean, dry and intact with no stain or active bleeding noted. Pt handed his prescriptions for Roxicet, senna, colace, zofran and aspirin. Pt home DME walker and 3N1 equipments delivered to pt at beside. Pt discharge home with mother to transport him home. Pt transported off unit via wheelchair with equipments and belongings to the side. Dionne Bucy RN
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
## 2016-04-17 NOTE — Progress Notes (Signed)
   Subjective: Patient reports pain as mild to moderate.  No c/o.  Objective:   VITALS:   Vitals:   04/16/16 0500 04/16/16 1409 04/16/16 2130 04/17/16 0648  BP: 123/72 139/79 130/82 111/71  Pulse: 85 72 92 88  Resp: 18 18 16 16   Temp: 98.2 F (36.8 C) 99.1 F (37.3 C) 98.8 F (37.1 C) 98.7 F (37.1 C)  TempSrc: Oral Oral Oral Oral  SpO2: 96% 100% 99% 100%  Weight:      Height:        Sensation intact distally Intact pulses distally Dorsiflexion/Plantar flexion intact Incision: dressing C/D/I Compartment soft No pain with passive stretch  Lab Results  Component Value Date   WBC 13.8 (H) 04/15/2016   HGB 12.9 (L) 04/15/2016   HCT 40.8 04/15/2016   MCV 92.1 04/15/2016   PLT 239 04/15/2016   BMET    Component Value Date/Time   NA 142 04/14/2016 0320   K 3.3 (L) 04/14/2016 0320   CL 105 04/14/2016 0320   GLUCOSE 119 (H) 04/14/2016 0320   BUN 15 04/14/2016 0320   CREATININE 1.09 04/15/2016 1628   GFRNONAA >60 04/15/2016 1628   GFRAA >60 04/15/2016 1628     Assessment/Plan: 2 Days Post-Op   Active Problems:   Closed fracture of shaft of tibia   Closed displaced spiral fracture of shaft of left tibia   NWB LLE DVT ppx: lovenox in house, out on ASA PO pain control PT/OT  d/c home today   Garnet Koyanagi 04/17/2016, 7:48 AM   Samson Frederic, MD Cell [phone]

## 2016-04-17 NOTE — Progress Notes (Signed)
Occupational Therapy Treatment Patient Details Name: Jordan Jordan MRN: [id number] DOB: 1975-11-29 Today's Date: 04/17/2016    History of present illness pt presents with L Tibia fx s/p ORIF.  pt with no PMH.     OT comments  Pt performing mobility, toileting, standing grooming and LB dressing at a modified independent level. Pt demonstrating good safety awareness and ability to maintain NWB on L LE.  No further OT needs.  Follow Up Recommendations  No OT follow up    Equipment Recommendations  3 in 1 bedside comode    Recommendations for Other Services      Precautions / Restrictions Precautions Precautions: Fall Restrictions Weight Bearing Restrictions: Yes LLE Weight Bearing: Non weight bearing       Mobility Bed Mobility Overal bed mobility: Modified Independent Bed Mobility: Supine to Sit;Sit to Supine           General bed mobility comments: assisted L LE up on 3 pillows once returned to supine  Transfers Overall transfer level: Modified independent Equipment used: Rolling walker (2 wheeled)             General transfer comment: pt does well maintaining NWBing    Balance Overall balance assessment: Needs assistance Sitting-balance support: No upper extremity supported;Feet supported Sitting balance-Leahy Scale: Good     Standing balance support: No upper extremity supported;During functional activity Standing balance-Leahy Scale: Fair Standing balance comment: pt able to stand at sink for oral care                   ADL Overall ADL's : Needs assistance/impaired     Grooming: Oral care;Standing;Modified independent               Lower Body Dressing: Modified independent;With adaptive equipment;Cueing for sequencing;Sit to/from stand Lower Body Dressing Details (indicate cue type and reason): educated in use of reacher, instructed to dress L LE first and undress last Toilet Transfer: Modified Independent;RW;BSC;Ambulation Toilet  Transfer Details (indicate cue type and reason): pt in knowledgeable in use of 3 in 1 over toilet Toileting- Clothing Manipulation and Hygiene: Modified independent;Sit to/from stand       Functional mobility during ADLs: Modified independent;Rolling walker General ADL Comments: Educated in use of bag or pockets to safely transport items with walker while maintaining NWB on L LE.      Vision                     Perception     Praxis      Cognition   Behavior During Therapy: WFL for tasks assessed/performed Overall Cognitive Status: Within Functional Limits for tasks assessed                       Extremity/Trunk Assessment               Exercises     Shoulder Instructions       General Comments      Pertinent Vitals/ Pain       Pain Assessment: Faces Pain Score: 7  Faces Pain Scale: Hurts even more Pain Location: L LE Pain Descriptors / Indicators: Grimacing Pain Intervention(s): Monitored during session;Premedicated before session;Repositioned;Ice applied  Home Living                                          Prior Functioning/Environment  Frequency       Progress Toward Goals  OT Goals(current goals can now be found in the care plan section)  Progress towards OT goals: Goals met/education completed, patient discharged from OT  Acute Rehab OT Goals Patient Stated Goal: Back to normal. Time For Goal Achievement: 04/23/16 Potential to Achieve Goals: Good  Plan Discharge plan remains appropriate    Co-evaluation                 End of Session Equipment Utilized During Treatment: Rolling walker   Activity Tolerance Patient tolerated treatment well   Patient Left in bed;with call bell/phone within reach;with family/visitor present   Nurse Communication          Time: 3428-7681 OT Time Calculation (min): 24 min  Charges: OT General Charges $OT Visit: 1 Procedure OT  Treatments $Self Care/Home Management : 23-37 mins  Malka So 04/17/2016, 9:46 AM  [phone]

## 2016-04-17 NOTE — Care Management Note (Signed)
Case Management Note  Patient Details  Name: Jordan Jordan MRN: [id number] Date of Birth: [birth year]  Subjective/Objective:   40 yr old male s/p IM Nailing of left tib/fib fracture.                 Action/Plan: Case manager spoke with patient concerning home health and DME needs. Choice was offered for Home Health agency. Referral was called to [facility], [facility].    Expected Discharge Date:   04/17/16               Expected Discharge Plan:  Home w Home Health Services  In-House Referral:     Discharge planning Services  CM Consult  Post Acute Care Choice:  Home Health, Durable Medical Equipment Choice offered to:  Patient  DME Arranged:  3-N-1, Walker rolling DME Agency:  [facility]  HH Arranged:  PT [facility] Agency:  [facility]  Status of Service:  Completed, signed off  If discussed at Long Length of Stay Meetings, dates discussed:    Additional Comments:  Durenda Guthrie, RN 04/17/2016, 11:50 AM

## 2016-04-17 NOTE — Discharge Instructions (Signed)
Non weightbearing left leg.  Ice per instructions.  Elevate toes above nose. Keep dressing clean and dry. Do not remove.

## 2016-04-17 NOTE — Progress Notes (Signed)
Physical Therapy Treatment Patient Details Name: Jordan Jordan MRN: [id number] DOB: 03-Jan-1976 Today's Date: 04/17/2016    History of Present Illness pt presents with L Tibia fx s/p ORIF.  pt with no PMH.      PT Comments    Pt moving well and with good awareness of safety.  Pt has been ambulating himself to/from bathroom without A and demonstrated good safety when PT present as pt ambulated to/from bathroom.  Discussed staying slow and ensuring home is set-up accessibly.  Pt is ready for D/C from PT stand point.    Follow Up Recommendations  Home health PT;Supervision - Intermittent     Equipment Recommendations  Rolling walker with 5" wheels;3in1 (PT)    Recommendations for Other Services       Precautions / Restrictions Precautions Precautions: Fall Restrictions Weight Bearing Restrictions: Yes LLE Weight Bearing: Non weight bearing    Mobility  Bed Mobility Overal bed mobility: Modified Independent                Transfers Overall transfer level: Modified independent               General transfer comment: pt does well maintaining NWBing  Ambulation/Gait Ambulation/Gait assistance: Supervision Ambulation Distance (Feet): 12 Feet (x2) Assistive device: Rolling walker (2 wheeled) Gait Pattern/deviations: Step-to pattern     General Gait Details: pt demonstrates good use of RW and maintains NWBing.     Stairs            Wheelchair Mobility    Modified Rankin (Stroke Patients Only)       Balance Overall balance assessment: Needs assistance Sitting-balance support: No upper extremity supported;Feet supported Sitting balance-Leahy Scale: Good     Standing balance support: No upper extremity supported;During functional activity Standing balance-Leahy Scale: Fair Standing balance comment: pt able to stand at sink for hand hygiene without UE support or leaning against sink.                      Cognition Arousal/Alertness:  Awake/alert Behavior During Therapy: WFL for tasks assessed/performed Overall Cognitive Status: Within Functional Limits for tasks assessed                      Exercises      General Comments        Pertinent Vitals/Pain Pain Assessment: 0-10 Pain Score: 7  Pain Location: L LE Pain Descriptors / Indicators: Aching;Grimacing;Guarding Pain Intervention(s): Monitored during session;Repositioned;RN gave pain meds during session    Home Living                      Prior Function            PT Goals (current goals can now be found in the care plan section) Acute Rehab PT Goals Patient Stated Goal: Back to normal. PT Goal Formulation: With patient Time For Goal Achievement: 04/23/16 Potential to Achieve Goals: Good Progress towards PT goals: Progressing toward goals    Frequency  Min 5X/week    PT Plan Equipment recommendations need to be updated    Co-evaluation             End of Session   Activity Tolerance: Patient tolerated treatment well Patient left: in bed;with call bell/phone within reach;with family/visitor present     Time: 0850-0909 PT Time Calculation (min) (ACUTE ONLY): 19 min  Charges:  $Gait Training: 8-22 mins  G CodesSunny Schlein, [place] [phone] 04/17/2016, 9:13 AM

## 2017-06-27 ENCOUNTER — Other Ambulatory Visit: Payer: Self-pay | Admitting: Orthopedic Surgery

## 2017-06-27 DIAGNOSIS — S82252K Displaced comminuted fracture of shaft of left tibia, subsequent encounter for closed fracture with nonunion: Secondary | ICD-10-CM

## 2017-06-30 ENCOUNTER — Ambulatory Visit
Admission: RE | Admit: 2017-06-30 | Discharge: 2017-06-30 | Disposition: A | Discharge status: Home / Self Care | Payer: BLUE CROSS/BLUE SHIELD | Source: Ambulatory Visit | Attending: Orthopedic Surgery | Admitting: Orthopedic Surgery

## 2017-06-30 DIAGNOSIS — S82252K Displaced comminuted fracture of shaft of left tibia, subsequent encounter for closed fracture with nonunion: Secondary | ICD-10-CM

## 2018-03-23 IMAGING — CR DG TIBIA/FIBULA 2V*L*
4 series · 4 of 4 positions shown · non-contrast
Comparison: None.

CLINICAL DATA: 40-year-old male with trauma to the left lower
extremity

EXAM:
LEFT TIBIA AND FIBULA - 2 VIEW; LEFT KNEE - 1-2 VIEW

[tibia ap (1 of 2)]
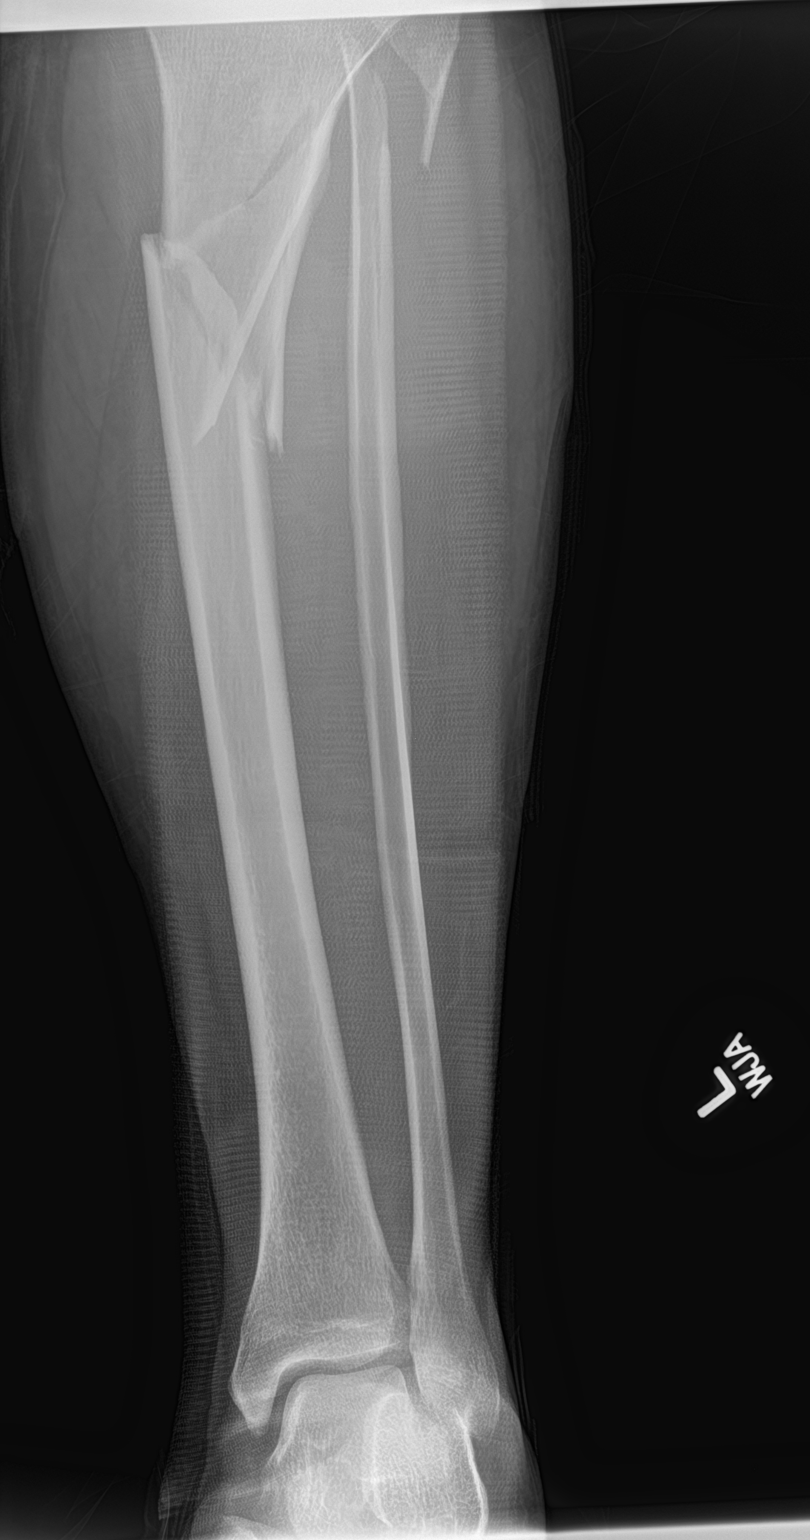

[tibia ap (2 of 2)]
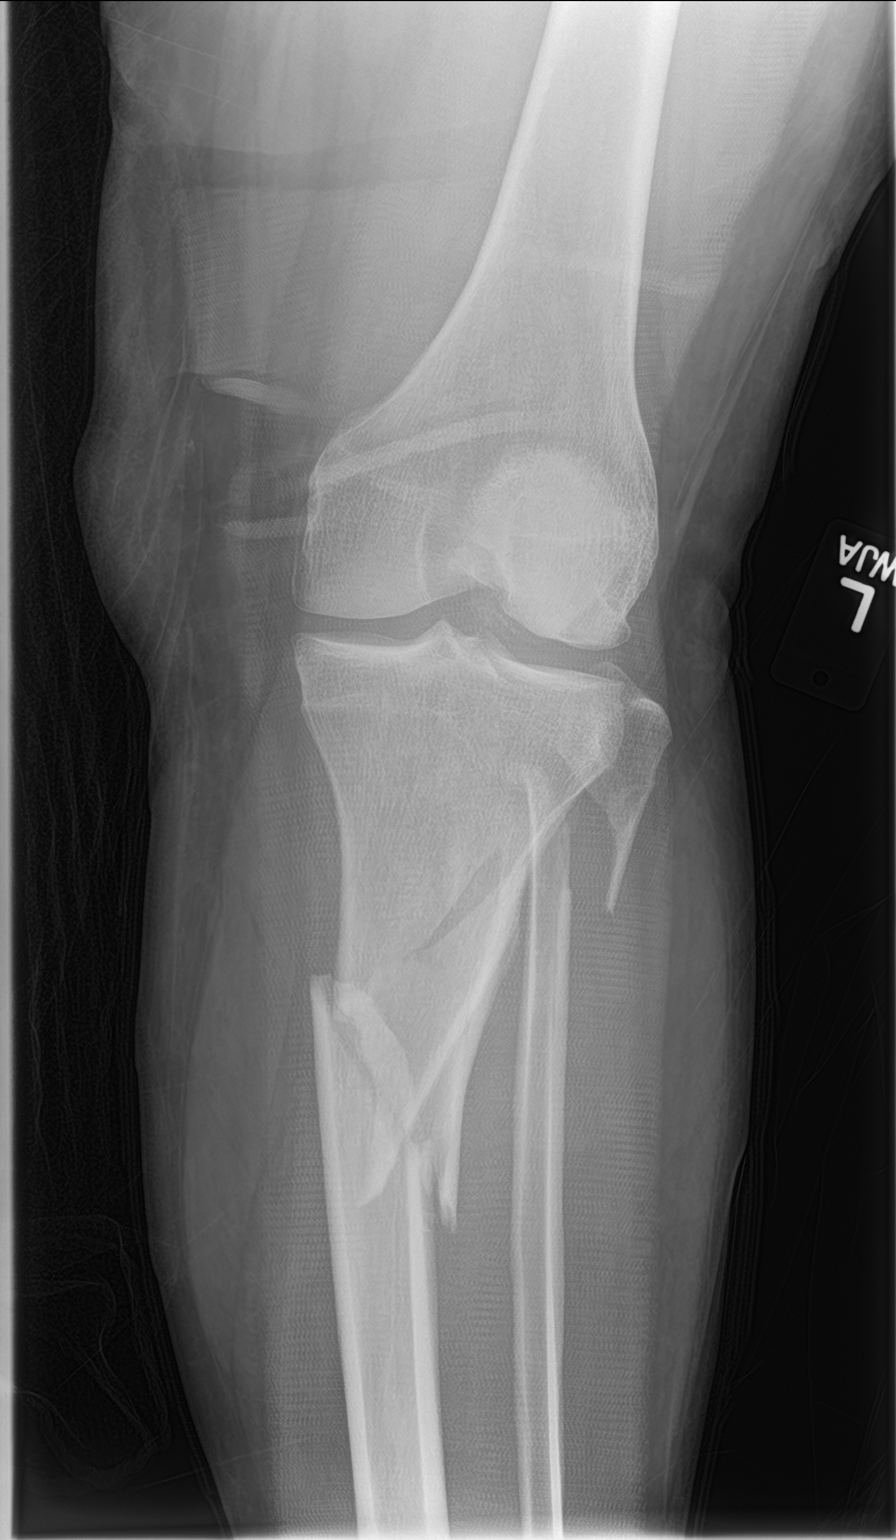

[tibia lat (1 of 2)]
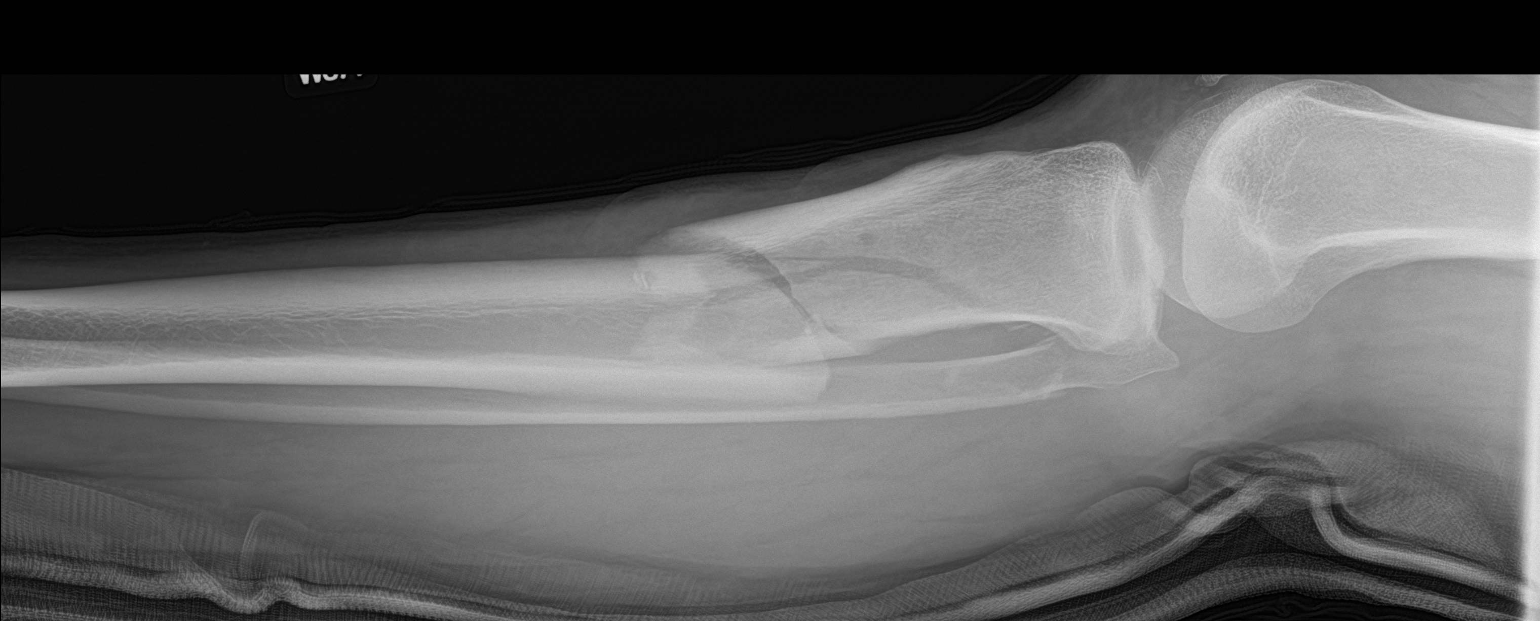

[tibia lat (2 of 2)]
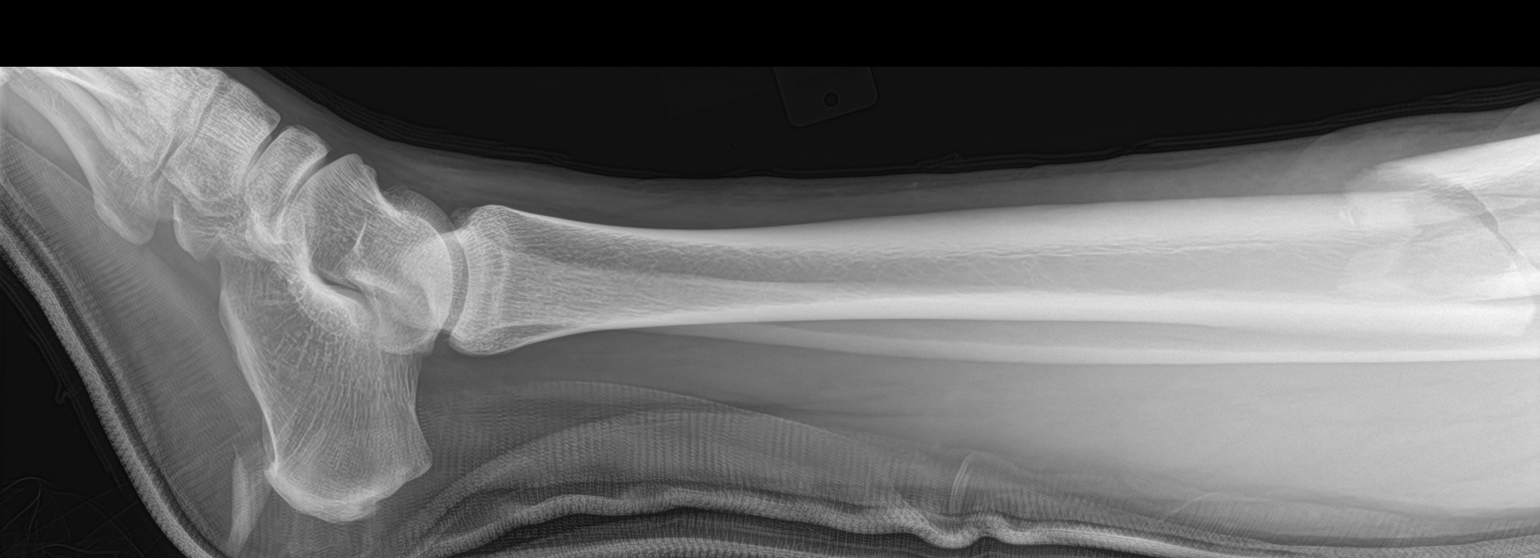

[4 of 4 positions shown; findings below may reference images not displayed]

FINDINGS: There is an oblique displaced fracture of the proximal fibula with
approximately 14 mm medial displacement of the distal fracture
fragment. There is a mildly displaced calf comminuted fracture of
the proximal tibial diaphysis with mild medial apex angulation.
There is no evidence of inter articular extension of the fracture.
The bones are well mineralized. There is no dislocation. No joint
effusion. The soft tissues are grossly unremarkable.
IMPRESSION: Comminuted fracture of the proximal tibial diaphysis as well as
displaced oblique fracture of the proximal fibula.
